# Patient Record
Sex: Female | Born: 1975 | Race: White | Hispanic: No | Marital: Single | Smoking: Never smoker
Health system: Southern US, Community
[De-identification: ages and names within clinical notes are randomized; demographics above are authoritative.]

## PROBLEM LIST (undated history)

## (undated) HISTORY — PX: FINGER SURGERY: SHX640

## (undated) HISTORY — PX: BREAST BIOPSY: SHX20

---

## 1998-02-26 ENCOUNTER — Ambulatory Visit (HOSPITAL_COMMUNITY): Admission: RE | Admit: 1998-02-26 | Discharge: 1998-02-26 | Payer: Self-pay | Admitting: Obstetrics & Gynecology

## 1998-02-27 ENCOUNTER — Other Ambulatory Visit: Admission: RE | Admit: 1998-02-27 | Discharge: 1998-02-27 | Payer: Self-pay | Admitting: Obstetrics & Gynecology

## 1998-07-14 ENCOUNTER — Inpatient Hospital Stay (HOSPITAL_COMMUNITY): Admission: AD | Admit: 1998-07-14 | Discharge: 1998-07-14 | Payer: Self-pay | Admitting: Obstetrics & Gynecology

## 1998-07-14 ENCOUNTER — Emergency Department (HOSPITAL_COMMUNITY): Admission: EM | Admit: 1998-07-14 | Discharge: 1998-07-14 | Payer: Self-pay | Admitting: Emergency Medicine

## 1998-07-27 ENCOUNTER — Inpatient Hospital Stay (HOSPITAL_COMMUNITY): Admission: AD | Admit: 1998-07-27 | Discharge: 1998-07-30 | Payer: Self-pay | Admitting: Obstetrics and Gynecology

## 1998-07-30 ENCOUNTER — Encounter (HOSPITAL_COMMUNITY): Admission: RE | Admit: 1998-07-30 | Discharge: 1998-10-28 | Payer: Self-pay | Admitting: *Deleted

## 1999-04-12 ENCOUNTER — Encounter: Payer: Self-pay | Admitting: Emergency Medicine

## 1999-04-12 ENCOUNTER — Emergency Department (HOSPITAL_COMMUNITY): Admission: EM | Admit: 1999-04-12 | Discharge: 1999-04-12 | Payer: Self-pay | Admitting: Emergency Medicine

## 2002-06-07 ENCOUNTER — Emergency Department (HOSPITAL_COMMUNITY): Admission: EM | Admit: 2002-06-07 | Discharge: 2002-06-07 | Payer: Self-pay | Admitting: Emergency Medicine

## 2003-10-07 ENCOUNTER — Emergency Department (HOSPITAL_COMMUNITY): Admission: EM | Admit: 2003-10-07 | Discharge: 2003-10-07 | Payer: Self-pay | Admitting: Emergency Medicine

## 2005-02-09 ENCOUNTER — Inpatient Hospital Stay (HOSPITAL_COMMUNITY): Admission: EM | Admit: 2005-02-09 | Discharge: 2005-02-09 | Payer: Self-pay | Admitting: Family Medicine

## 2005-02-23 ENCOUNTER — Ambulatory Visit (HOSPITAL_COMMUNITY): Admission: RE | Admit: 2005-02-23 | Discharge: 2005-02-23 | Payer: Self-pay | Admitting: *Deleted

## 2005-08-17 ENCOUNTER — Emergency Department (HOSPITAL_COMMUNITY): Admission: EM | Admit: 2005-08-17 | Discharge: 2005-08-17 | Payer: Self-pay | Admitting: Emergency Medicine

## 2005-11-09 ENCOUNTER — Inpatient Hospital Stay (HOSPITAL_COMMUNITY): Admission: EM | Admit: 2005-11-09 | Discharge: 2005-11-11 | Payer: Self-pay | Admitting: Family Medicine

## 2006-09-12 ENCOUNTER — Emergency Department (HOSPITAL_COMMUNITY): Admission: EM | Admit: 2006-09-12 | Discharge: 2006-09-12 | Payer: Self-pay | Admitting: Emergency Medicine

## 2007-08-16 ENCOUNTER — Emergency Department (HOSPITAL_COMMUNITY): Admission: EM | Admit: 2007-08-16 | Discharge: 2007-08-16 | Payer: Self-pay | Admitting: Family Medicine

## 2007-09-29 ENCOUNTER — Emergency Department (HOSPITAL_COMMUNITY): Admission: EM | Admit: 2007-09-29 | Discharge: 2007-09-29 | Payer: Self-pay | Admitting: Family Medicine

## 2008-06-19 ENCOUNTER — Inpatient Hospital Stay (HOSPITAL_COMMUNITY): Admission: AD | Admit: 2008-06-19 | Discharge: 2008-06-19 | Payer: Self-pay | Admitting: Obstetrics and Gynecology

## 2008-06-20 ENCOUNTER — Inpatient Hospital Stay (HOSPITAL_COMMUNITY): Admission: AD | Admit: 2008-06-20 | Discharge: 2008-06-24 | Payer: Self-pay | Admitting: Obstetrics and Gynecology

## 2009-04-23 ENCOUNTER — Emergency Department (HOSPITAL_COMMUNITY): Admission: EM | Admit: 2009-04-23 | Discharge: 2009-04-23 | Payer: Self-pay | Admitting: Family Medicine

## 2009-06-21 ENCOUNTER — Emergency Department (HOSPITAL_COMMUNITY): Admission: EM | Admit: 2009-06-21 | Discharge: 2009-06-21 | Payer: Self-pay | Admitting: Family Medicine

## 2009-10-19 ENCOUNTER — Emergency Department (HOSPITAL_COMMUNITY): Admission: EM | Admit: 2009-10-19 | Discharge: 2009-10-19 | Payer: Self-pay | Admitting: Family Medicine

## 2009-11-20 ENCOUNTER — Emergency Department (HOSPITAL_COMMUNITY): Admission: EM | Admit: 2009-11-20 | Discharge: 2009-11-20 | Payer: Self-pay | Admitting: Family Medicine

## 2010-08-15 ENCOUNTER — Encounter: Payer: Self-pay | Admitting: *Deleted

## 2010-12-07 NOTE — Op Note (Signed)
Zoe King, Zoe King               ACCOUNT NO.:  [id number]   MEDICAL RECORD NO.:  [id number]          PATIENT TYPE:  INP   LOCATION:  9127                          FACILITY:  [facility]   PHYSICIAN:  Malva Limes, M.D.    DATE OF BIRTH:  Apr 02, 1976   DATE OF PROCEDURE:  06/21/2008  DATE OF DISCHARGE:                               OPERATIVE REPORT   PREOPERATIVE DIAGNOSES:  1. Intrauterine pregnancy at term.  2. History of prior cesarean section.  3. Attempted vaginal birth after cesarean section.  4. Fetal decelerations.   POSTOPERATIVE DIAGNOSES:  1. Intrauterine pregnancy at term.  2. History of prior cesarean section.  3. Attempted vaginal birth after cesarean section.  4. Fetal decelerations.   PROCEDURE:  Repeat low transverse cesarean section.   SURGEON:  Malva Limes, MD   ANESTHESIA:  Epidural.   ANTIBIOTICS:  Ancef 1 g.   DRAINS:  Foley to bedside drainage.   ESTIMATED BLOOD LOSS:  900 mL.   SPECIMENS:  None.   FINDINGS:  The patient had normal fallopian tubes and ovaries  bilaterally.  The uterus appeared to be normal.  The scar was intact.  The patient had several omental adhesions to the anterior abdominal  wall, which were taken down with the Bovie.  The patient delivered one  live viable infant in the OP presentation.  Meconium fluid was noted.  There was no nuchal cord.  Placenta appeared to be normal.   PROCEDURE:  The patient was taken to the operating room, where she was  placed in dorsal supine position with a left lateral tilt.  Once an  adequate level was reached, the patient was prepped and draped in usual  fashion for this procedure.  A Pfannenstiel incision was made through  the previous scar.  On entering the abdominal cavity, the bladder flap  was taken down with sharp dissection.  A low transverse uterine incision  was made in the midline with Metzenbaum scissors and extended laterally  with blunt dissection.  Meconium-stained amniotic fluid  was noted.  At  this point, the infant was delivered in the vertex presentation.  On the  delivery of the head, the oropharynx and nostrils were bulb suctioned.  The remaining infant was then delivered.  The cord was doubly clamped  and cut and the infant handed to awaiting NICU team.  The placenta was  then manually removed.  The uterus was exteriorized.  The uterine cavity  with a wet lap and inspected.  The uterine incision was closed in a  single layer of 0 Monocryl suture in a running locking fashion.  The  bladder flap was closed using 2-0 Monocryl suture in a running fashion.  The uterus was placed back in the abdominal cavity.  Hemostasis was  again checked and felt to be adequate.  The parietal peritoneum and  rectus muscles were approximated in midline with 2-0 Monocryl in a  running fashion.  The fascia was closed using 0 Monocryl suture in a  running fashion.  The subcuticular tissue was made hemostatic with the  Bovie.  The subcuticular tissue  was closed using interrupted 2-0 plain  gut suture.  The skin was closed using stainless steel clips.  The  patient tolerated the procedure well.  She was taken to recovery room in  stable condition.  Instrument and lap count was correct x2.           ______________________________  Malva Limes, M.D.     MA/MEDQ  D:  06/21/2008  T:  06/21/2008  Job:  [id number]

## 2010-12-10 NOTE — H&P (Signed)
Zoe King, Zoe King               ACCOUNT NO.:  [id number]   MEDICAL RECORD NO.:  [id number]          PATIENT TYPE:  INP   LOCATION:  1824                         FACILITY:  [facility]   PHYSICIAN:  Nelma Rothman, MD   DATE OF BIRTH:  August 02, 1975   DATE OF ADMISSION:  11/09/2005  DATE OF DISCHARGE:                                HISTORY & PHYSICAL   PRIMARY CARE PHYSICIAN:  Unassigned.   CHIEF COMPLAINT:  Shortness of breath.   HISTORY OF PRESENT ILLNESS:  The patient is a 35 year old female with a  known history of asthma, who is presenting with a 1-week history of  increasing shortness of breath and wheezing.  She is using her albuterol  inhaler quite frequently at home.  She has a cough productive of yellow  sputum and sounds quite congested.  She states that she felt warm  yesterday, but no documented fever.  She received continuous nebulizer  treatments as well as IV Solu-Medrol and IV magnesium in the emergency  department and she is feeling much better, but oxygen saturation on room air  is still only 88%, so we are asked to admit her for further treatment of  asthma exacerbation.   PAST MEDICAL HISTORY:  Asthma, has only required hospitalization once before  for her asthma.   ALLERGIES:  VICODIN causes a rash.   MEDICATIONS:  Albuterol inhaler as needed.   SOCIAL HISTORY:  She lives in [place] and works as a Financial risk analyst.  She quit  smoking 8 years ago and drinks alcohol only rarely.   FAMILY HISTORY:  Grandmother has heart disease.  Mom and dad are alive and  well.   REVIEW OF SYSTEMS:  Negative 10-point review of systems except as noted in  HPI.   PHYSICAL EXAM:  VITAL SIGNS:  Temperature 97.5, blood pressure 144/68, pulse  98, respiration rate 22.  Oxygen saturation 88% on room air and now 98% on 2  L after continuous nebulizers and Solu-Medrol.  GENERAL:  She is slightly dyspneic, but conversant.  HEART:  Regular rate and rhythm with no murmurs, rubs or gallops.  LUNGS:  Her lungs still sound quite tight with diffuse wheezing.  ABDOMEN:  Soft, nontender and nondistended with normoactive bowel sounds.  EXTREMITIES:  There is no edema.   LABORATORY DATA:  White blood cell count 11.1, hemoglobin 14.4, hematocrit  42.6 and platelet count 329,000.  Sodium 140, potassium 3.4, chloride 108,  BUN 12, creatinine 0.70 and glucose 144.   Chest x-ray demonstrates mild changes of asthma versus bronchitis, but no  consolidation and overall stable since July 2006.   ASSESSMENT AND PLAN:  1.  Asthma exacerbation.  We will continue intravenous Solu-Medrol and q.6      h. nebulizer treatments.  We will add Singulair as I suspect seasonal      allergies are playing a large component in this exacerbation.      Leukocytosis is difficult to interpret, since she had already received      her steroids, so we will follow conservatively off antibiotics, since no      evidence  of pneumonia.  2.  Hyperglycemia, again likely secondary to steroids.  However, we will      check hemoglobin A1c.      Nelma Rothman, MD  Electronically Signed     RAR/MEDQ  D:  11/09/2005  T:  11/09/2005  Job:  [id number]

## 2010-12-10 NOTE — Discharge Summary (Signed)
NAMEKINLEY, DOZIER               ACCOUNT NO.:  [id number]   MEDICAL RECORD NO.:  [id number]          PATIENT TYPE:  INP   LOCATION:  6713                         FACILITY:  [facility]   PHYSICIAN:  Mobolaji B. Bakare, M.D.DATE OF BIRTH:  12/05/1975   DATE OF ADMISSION:  11/09/2005  DATE OF DISCHARGE:  11/11/2005                                 DISCHARGE SUMMARY   PRIMARY CARE PHYSICIAN:  Unassigned.   FINAL DIAGNOSES:  1.  Asthma exacerbation.  2.  Obesity.   PROCEDURE:  Chest x-ray showed mild changes asthma versus bronchitis without  localized consolidation. This was done on the 17th of April 2007.   BRIEF HISTORY:  Ms. Altland is a 35 year old patient known with asthma who  presented with one week of persistent coughing, increased shortness of  breath and wheezing. She uses albuterol MDI frequently. She stated more than  3 times a week. In addition, she is aware that she reacts to cat. One of her  sisters has a cat and she frequently goes to her sister's house.   This current acute exacerbation was not amendable to home regimen of  frequent albuterol use and she decided to come to the emergency room.  Initial vitals she was afebrile, blood pressure was hemodynamically stable.  O2 sats was 88% on room air. She is improved to 92% on 2 liters. Lung  examination revealed diffuse wheezing bilaterally. She was given Solu-Medrol  and nebulizer in the emergency room and subsequently admitted.   HOSPITAL COURSE:  Ms. Ackerley was consulted against exposure to known  allergies. She was continued on IV Solu-Medrol. This was transitioned to  p.o. prednisone. In addition, she received Singulair nebulization with  albuterol. She improved over the course of next 38-72 hours. She was  discharged home on Flovent and Singulair. She did give a history of  intermittent heartburn and Prilosec was given p.r.n.   DISCHARGE MEDICATIONS:  1.  Flovent HFA 88 mcg 2 tabs a day.  2.  Singulair 10 mg  daily.  3.  Albuterol 2 puffs p.r.n.  4.  Prednisone taper.  5.  Mucinex 600 mg t.i.d.   Followup with Dr. Mikeal Hawthorne.   SPECIAL INSTRUCTIONS:  Avoid cats.      Mobolaji B. Corky Downs, M.D.  Electronically Signed     MBB/MEDQ  D:  12/05/2005  T:  12/06/2005  Job:  [id number]   cc:   Lonia Blood, M.D.

## 2010-12-10 NOTE — H&P (Signed)
NAMEVOLA, BENEKE               ACCOUNT NO.:  [id number]   MEDICAL RECORD NO.:  [id number]          PATIENT TYPE:  INP   LOCATION:  1828                         FACILITY:  [facility]   PHYSICIAN:  Melissa L. Ladona Ridgel, MD  DATE OF BIRTH:  1976/01/22   DATE OF ADMISSION:  02/08/2005  DATE OF DISCHARGE:                                HISTORY & PHYSICAL   CHIEF COMPLAINT:  Shortness of breath and wheezing; I could not breathe.   PRIMARY CARE PHYSICIAN:  None.   HISTORY OF THE PRESENT ILLNESS:  The patient is a 35 year old white female  who states that she spent time at her sister's house, where there are cats,  yesterday.  She states that at the time she started to develop chest  tightness and feeling funny.  This A.M. she continued to have chest  tightness with progression on to dyspnea on exertion, cough and progressive  worsening of her symptoms.  The patient states that three to four months ago  she had a similar experience when she was visiting her mother; a person who  has cats.  She states there was no intervention at that time and she Weeded  it out.  She states the symptoms lasted one week.  Today she had taken two  Benadryl without getting any relief.  She went to the urgent care center and  was sent to the emergency room for further care.   REVIEW OF SYSTEMS:  The patient has a fever.  No nausea.  No vomiting.  No  itching.  No rash.  No dysuria.  No constipation.  No diarrhea.  All other  systems are negative.  Please see the HPI for pertinent positives.   PAST MEDICAL HISTORY:  Questionable cat allergy.  Hives with VICODIN.   PAST SURGICAL HISTORY:  C section and surgery on the fifth digit of the  right hand.   SOCIAL HISTORY:  The patient quit tobacco six years ago.  She drinks  infrequent alcoholic beverages.  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   FAMILY HISTORY:  Mother is living and father is living; both have no medical  illnesses to speak of.  A sister and two  brother also have no medical  problems.   ALLERGIES:  Hives with VICODIN and allergy to CATS.   MEDICATIONS:  Medications at present are none.   PHYSICAL EXAMINATION:  VITAL SIGNS:  Temperature is 100.7 on admission and  now 98.2.  Blood pressure 122/71, heart rate is 88-106, and saturation is  89% on room air.  GENERAL APPEARANCE:  In general the patient is in no acute distress, but  does appear ill.  HEENT:  The patient is normocephalic and atraumatic.  Pupils equal, round  and react to light.  Extraocular muscles are intact.  Mucous membranes are  moist.  NECK:  The neck is supple.  There is no JVD.  No lymph nodes and no carotid  bruits.  CHEST:  The chest shows decreased air entry bilaterally with no wheezing,  but with occasional rhonchi heard.  HEART:  Cardiovascular shows regular rate  and rhythm.  Positive S1 and S2.  No S3 or S4.  No murmurs, rubs or gallops.  ABDOMEN:  The abdomen is soft and nontender and nondistended with positive  bowel sounds.  EXTREMITIES:  The extremities show no clubbing, cyanosis or edema.  NEUROLOGIC EXAMINATION:  Neurologically she is awake, alert and oriented  times three.  Cranial nerves II-XII are intact.  Power is 5/5.   LABORATORY DATA:  Currently her sodium is 136, potassium 3.8, chloride 107,  BUN 13, and blood glucose 121.  White count is 17.4 with 88% neutrophilic  predominance and eosinophils are only 1%, her hemoglobin is 24.5, hematocrit  is 43, and platelets are 340,000.  Her D-dimer is 0.36.  Chest x-ray shows  bronchitic changes, but no obvious infiltrates.  Peak flow prior to nebulize  treatment was 158, post peak flow was 260.   ASSESSMENT AND PLAN:  This is a 35 year old white female who appears to have  an acute asthmatic attack, possibly exacerbated by CAT exposure; however, at  this time I cannot rule out an acute bronchitis or alternative source for  her high fever and white count.  More than likely this is an acute  asthmatic  attack.  The patient experienced a similar episode just three to four months  ago with spontaneous resolution over the course of a week.   1.  Pulmonary; allergic asthma with fever and increased white count.   We will continue nebulizers q.4 hours and q.2 hours p.r.n.  We will start  her on ceftriaxone  and azithromycin.  We will start prednisone 40 mg p.o.  daily with a rapid taper.  She will need outpatient pulmonary follow up for  possible asthma.  Supplemental O2 will be maintained at this time.   1.  Cardiovascular; blood pressure and pulse are stable.  2.  Gastrointestinal; no current complaints, but we will start her on      Protonix p.o. for reflux protection while on steroids.  3.  Genitourinary;  no complaints or issues.  We will check a urine culture      and sensitivity as the source for her increased white blood cells and      fever.  4.  Endocrine; we will check her blood sugars with meals while on steroids      and hemoglobin A-1-C.  At present her blood glucose is 121.  5.  Deep venous thrombosis prophylaxis will be with PAS hose and ambulation.      If the patient continues to be with [place] for several days we may opt to      switch her to Lovenox.       MLT/MEDQ  D:  02/09/2005  T:  02/09/2005  Job:  [id number]

## 2010-12-10 NOTE — Discharge Summary (Signed)
Zoe King, Zoe King               ACCOUNT NO.:  [id number]   MEDICAL RECORD NO.:  [id number]          PATIENT TYPE:  INP   LOCATION:  9127                          FACILITY:  [facility]   PHYSICIAN:  Malva Limes, M.D.    DATE OF BIRTH:  1976/07/10   DATE OF ADMISSION:  06/20/2008  DATE OF DISCHARGE:  06/24/2008                               DISCHARGE SUMMARY   FINAL DIAGNOSES:  1. Intrauterine pregnancy at term.  2. History of prior cesarean section, attempted vaginal birth after      cesarean section.  3. Fetal decelerations.   PROCEDURE:  Repeat low transverse cesarean section.   SURGEON:  Malva Limes, MD   COMPLICATIONS:  None.   This is a 35 year old, G2, P1-0-0-1, who presents to the [facility] at term in labor.  The patient had had a prior cesarean section  with her last pregnancy and desires an attempt for VBAC with this  pregnancy.  The patient's antepartum course up to this point had been  uncomplicated.  We did notice a single umbilical artery on ultrasound,  but otherwise growth and antepartum course were good.  The patient was  admitted at this time in labor at term.  She was already 4-cm dilated,  and 80% effaced, and -2 station.  While in the ER, the patient was noted  to have T-cell count of 80.  AROM was performed.  The patient continued  to have recurrent variable decelerations.  Therefore, amnioinfusion was  started.  This corrected the problems.  The patient completed the first  stage of labor.  After pushing, there was some fetal decelerations down  to the 60s for about 45 minutes, but after position change in oxygen the  heart returned to normal, but after each push, there were T cells.  At  this point, a discussion was held with the patient and decision was made  to proceed with a cesarean section.  Fetal heart tones on the way to the  operating room were great in the 170s.  The patient was taken to the  operating room on June 21, 2008 by Dr. Malva Limes, where a repeat  low transverse cesarean section was performed with the delivery of a 6-  pound and 15-ounce female infant with Apgars of 9 and 9.  Delivery went  without complications.  The patient's postoperative course was benign  without any significant fevers.  The patient was felt ready for  discharge.  On postoperative day #3, she was sent home on a regular  diet, told to decrease her activities, told to continue her prenatal  vitamins, was given a prescription for Percocet 1-2 every 4-6 hours as  needed for her pain, was to follow up in our office in 4 weeks.  Instructions and precautions were reviewed with the patient.   LABS ON DISCHARGE:  The patient had a hemoglobin of 11.2, white blood  cell count of 20.0, and platelets of 234,000.      Leilani Able, P.A.-C.    ______________________________  Malva Limes, M.D.    MB/MEDQ  D:  07/30/2008  T:  07/31/2008  Job:  [id number]

## 2011-04-14 LAB — POCT RAPID STREP A: Streptococcus, Group A Screen (Direct): NEGATIVE

## 2011-04-26 LAB — CBC
HCT: 37.5 % (ref 36.0–46.0)
Hemoglobin: 12.5 g/dL (ref 12.0–15.0)
MCHC: 33.3 g/dL (ref 30.0–36.0)
MCHC: 33.4 g/dL (ref 30.0–36.0)
MCV: 80.7 fL (ref 78.0–100.0)
MCV: 81.5 fL (ref 78.0–100.0)
Platelets: 234 10*3/uL (ref 150–400)
Platelets: 251 10*3/uL (ref 150–400)
RBC: 4.65 MIL/uL (ref 3.87–5.11)
RDW: 15.6 % — ABNORMAL HIGH (ref 11.5–15.5)
RDW: 16.3 % — ABNORMAL HIGH (ref 11.5–15.5)
WBC: 14.4 10*3/uL — ABNORMAL HIGH (ref 4.0–10.5)

## 2011-06-24 ENCOUNTER — Emergency Department (INDEPENDENT_AMBULATORY_CARE_PROVIDER_SITE_OTHER)
Admission: EM | Admit: 2011-06-24 | Discharge: 2011-06-24 | Disposition: A | Discharge status: Home / Self Care | Payer: Self-pay | Source: Home / Self Care | Attending: Family Medicine | Admitting: Family Medicine

## 2011-06-24 DIAGNOSIS — M5380 Other specified dorsopathies, site unspecified: Secondary | ICD-10-CM

## 2011-06-24 DIAGNOSIS — M62830 Muscle spasm of back: Secondary | ICD-10-CM

## 2011-06-24 MED ORDER — TRAMADOL HCL 50 MG PO TABS: 50.0000 mg | ORAL_TABLET | Freq: Four times a day (QID) | ORAL | Status: AC | PRN

## 2011-06-24 MED ORDER — CYCLOBENZAPRINE HCL 5 MG PO TABS: 5.0000 mg | ORAL_TABLET | Freq: Three times a day (TID) | ORAL | Status: AC | PRN

## 2011-06-24 MED ORDER — PREDNISONE (PAK) 10 MG PO TABS: ORAL_TABLET | ORAL | Status: AC

## 2011-06-24 NOTE — ED Provider Notes (Signed)
History     CSN: [id number] Arrival date & time: 06/24/2011 10:01 AM   First MD Initiated Contact with Patient 06/24/11 1017      Chief Complaint  Patient presents with  . Back Pain    (Consider location/radiation/quality/duration/timing/severity/associated sxs/prior treatment) HPI Comments: Zoe King presents for evaluation of pain in her middle upper back that occurred upon awakening this morning. She denies any injury and no change in her sleep environment. She denies any numbness, tingling, or weakness in her upper extremity. The pain is worse with neck movement and turning of her head.  Patient is a 35 y.o. female presenting with back pain. The history is provided by the patient.  Back Pain  This is a new problem. The current episode started 6 to 12 hours ago. The problem occurs constantly. The problem has not changed since onset.The pain is associated with no known injury. The pain is present in the thoracic spine. The pain is moderate. The symptoms are aggravated by bending, twisting and certain positions. Pertinent negatives include no chest pain, no fever, no numbness, no headaches, no paresthesias, no paresis, no tingling and no weakness. She has tried nothing for the symptoms.    Past Medical History  Diagnosis Date  . Asthma     Past Surgical History  Procedure Date  . Cesarean section   . Finger surgery     No family history on file.  History  Substance Use Topics  . Smoking status: Never Smoker   . Smokeless tobacco: Not on file  . Alcohol Use: No    OB History    Grav Para Term Preterm Abortions TAB SAB Ect Mult Living                  Review of Systems  Constitutional: Negative for fever.  HENT: Positive for neck pain and neck stiffness. Negative for congestion, sore throat, rhinorrhea and trouble swallowing.   Eyes: Negative.   Respiratory: Negative.   Cardiovascular: Negative for chest pain.  Gastrointestinal: Negative.   Genitourinary: Negative.     Musculoskeletal: Positive for myalgias and back pain.  Skin: Negative.   Neurological: Negative for dizziness, tingling, weakness, light-headedness, numbness, headaches and paresthesias.    Allergies  Vicodin  Home Medications  No current outpatient prescriptions on file.  BP 129/80  Pulse 64  Temp(Src) 98.3 F (36.8 C) (Oral)  Resp 18  SpO2 98%  LMP 05/28/2011  Physical Exam  Nursing note and vitals reviewed. Constitutional: She is oriented to person, place, and time. She appears well-developed and well-nourished.  HENT:  Head: Normocephalic and atraumatic.  Eyes: EOM are normal.  Neck: Trachea normal. Neck supple. Muscular tenderness present. Decreased range of motion present. No mass and no thyromegaly present.    Pulmonary/Chest: Effort normal.  Neurological: She is alert and oriented to person, place, and time. She has normal strength.  Skin: Skin is warm and dry.    ED Course  Procedures (including critical care time)  Labs Reviewed - No data to display No results found.   No diagnosis found.    MDM  Muscle spasm, RIGHT trapezius        Richardo Priest, MD 06/24/11 1113

## 2011-06-24 NOTE — ED Notes (Signed)
C/o pain between scapula.  States it started this am- worse with movement.  Describes pain as pulling and sharp

## 2015-09-24 ENCOUNTER — Encounter (HOSPITAL_COMMUNITY): Payer: Self-pay | Admitting: Emergency Medicine

## 2015-09-24 ENCOUNTER — Emergency Department (HOSPITAL_COMMUNITY)
Admission: EM | Admit: 2015-09-24 | Discharge: 2015-09-24 | Disposition: A | Discharge status: Home / Self Care | Payer: 59 | Attending: Emergency Medicine | Admitting: Emergency Medicine

## 2015-09-24 DIAGNOSIS — M25561 Pain in right knee: Secondary | ICD-10-CM | POA: Diagnosis present

## 2015-09-24 DIAGNOSIS — J45909 Unspecified asthma, uncomplicated: Secondary | ICD-10-CM | POA: Insufficient documentation

## 2015-09-24 DIAGNOSIS — G8929 Other chronic pain: Secondary | ICD-10-CM | POA: Diagnosis not present

## 2015-09-24 MED ORDER — KETOROLAC TROMETHAMINE 60 MG/2ML IM SOLN
60.0000 mg | Freq: Once | INTRAMUSCULAR | Status: AC
  Administered 2015-09-24: 60 mg via INTRAMUSCULAR
  Filled 2015-09-24: qty 2

## 2015-09-24 MED ORDER — IBUPROFEN 800 MG PO TABS: 800.0000 mg | ORAL_TABLET | Freq: Three times a day (TID) | ORAL | Status: AC

## 2015-09-24 NOTE — ED Notes (Signed)
Patient presents for right knee pain x1 day. No known injury to same, reports yesterday went to sit in chair and felt a pulling sensation. No obvious deformity, redness, or swelling noted to same. Denies numbness or tingling. Rates pain 9/10.

## 2015-09-24 NOTE — ED Provider Notes (Signed)
CSN: [id number]     Arrival date & time 09/24/15  1319 History  By signing my name below, I, Soijett Blue, attest that this documentation has been prepared under the direction and in the presence of Shawn Joy, PA-C Electronically Signed: Soijett Blue, ED Scribe. 09/24/2015. 2:20 PM.    Chief Complaint  Patient presents with  . Knee Pain      The history is provided by the patient. No language interpreter was used.    Zoe King is a 40 y.o. female who presents to the Emergency Department complaining of constant right knee pain onset yesterday. Pt had a right knee injury occur several years ago which she was evaluated in the ED for at that time. Pt didn't go to the orthopedist due to her right knee pain resolving shortly after the ED visit. She states that she attempted to sit in a chair yesterday when she felt a "pulling sensation" to the back of her right knee and she feels as if this is a resurgence of her old right knee injury. Pt reports that her right knee pain is worsened with bearing weight and rates her right knee pain while weight bearing as 7-8/10. Pt notes that her right knee pain is alleviated with rest. Pt denies any recent injury/trauma to the right knee. Pt is having associated symptoms of gait problem due to pain. She notes that she has tried icy hot cream, tylenol, aleve, and ibuprofen for the relief of her symptoms. She denies color change, wound, rash, swelling, recent surgery, neuro deficits, or any other symptoms. Pt adds she was seen at [facility] when she had to have her left knee pain evaluated (separate incident).    Past Medical History  Diagnosis Date  . Asthma    Past Surgical History  Procedure Laterality Date  . Cesarean section    . Finger surgery     No family history on file. Social History  Substance Use Topics  . Smoking status: Never Smoker   . Smokeless tobacco: None  . Alcohol Use: Yes   OB History    No data available      Review of Systems  Constitutional: Negative for fever and chills.  Gastrointestinal: Negative for nausea and vomiting.  Musculoskeletal: Positive for arthralgias and gait problem (due to pain). Negative for joint swelling.  Skin: Negative for color change, rash and wound.  Neurological: Negative for weakness and numbness.      Allergies  Vicodin  Home Medications   Prior to Admission medications   Medication Sig Start Date End Date Taking? Authorizing Provider  ibuprofen (ADVIL,MOTRIN) 800 MG tablet Take 1 tablet (800 mg total) by mouth 3 (three) times daily. 09/24/15   Shawn C Joy, PA-C   BP 155/90 mmHg  Pulse 97  Temp(Src) 98.1 F (36.7 C) (Oral)  Resp 18  SpO2 96%  LMP 09/17/2015 Physical Exam  Constitutional: She is oriented to person, place, and time. She appears well-developed and well-nourished. No distress.  HENT:  Head: Normocephalic and atraumatic.  Neck: Neck supple.  Cardiovascular: Normal rate and intact distal pulses.   Pulmonary/Chest: Effort normal. No respiratory distress.  Musculoskeletal: Normal range of motion.       Right knee: She exhibits no swelling, no effusion, no deformity, no LCL laxity and no MCL laxity. Tenderness found.  Full ROM, both passive and active. No discernible swelling or effusion. Minor tenderness on the posterior of the right knee. Right knee is not warm to the  touch when compared to left knee. No laxity, crepitus or deformity. CMS intact distally. dp pulses intact.  Neurological: She is alert and oriented to person, place, and time. She has normal reflexes.  No sensory deficits. Strength 5/5.   Skin: Skin is warm and dry. No erythema.  Psychiatric: She has a normal mood and affect. Her behavior is normal.  Nursing note and vitals reviewed.   ED Course  Procedures (including critical care time) DIAGNOSTIC STUDIES: Oxygen Saturation is 96% on RA, nl by my interpretation.    COORDINATION OF CARE: 2:19 PM Discussed treatment  plan with pt at bedside which includes ibuprofen, knee sleeve, crutches, referral and follow up with orthopedist, and pt agreed to plan.     MDM   Final diagnoses:  Knee pain, chronic, right    Zoe King presents with right knee pain that recurred yesterday.  This patient has a normal neuro exam and no functional deficits. Suspicion is very low for septic joint and the patient has no risk factors for such. No indication for imaging at this time due to the lack of trauma and the normal physical exam. Patient was advised to follow-up with orthopedics. Knee sleeve and crutches provided. Return precautions discussed. Patient voiced understanding of instructions.  I personally performed the services described in this documentation, which was scribed in my presence. The recorded information has been reviewed and is accurate.   Anselm Pancoast, PA-C 09/24/15 1440  Bethann Berkshire, MD 09/25/15 1324

## 2015-09-24 NOTE — Discharge Instructions (Signed)
You have been seen today for knee pain. All up with orthopedics for further evaluation and chronic management of this issue. Follow up with PCP as needed. Return to ED should symptoms worsen.   Emergency Department Resource Guide 1) Find a Doctor and Pay Out of Pocket Although you won't have to find out who is covered by your insurance plan, it is a good idea to ask around and get recommendations. You will then need to call the office and see if the doctor you have chosen will accept you as a new patient and what types of options they offer for patients who are self-pay. Some doctors offer discounts or will set up payment plans for their patients who do not have insurance, but you will need to ask so you aren't surprised when you get to your appointment.  2) Contact Your Local Health Department Not all health departments have doctors that can see patients for sick visits, but many do, so it is worth a call to see if yours does. If you don't know where your local health department is, you can check in your phone book. The CDC also has a tool to help you locate your state's health department, and many state websites also have listings of all of their local health departments.  3) Find a Walk-in Clinic If your illness is not likely to be very severe or complicated, you may want to try a walk in clinic. These are popping up all over the country in pharmacies, drugstores, and shopping centers. They're usually staffed by nurse practitioners or physician assistants that have been trained to treat common illnesses and complaints. They're usually fairly quick and inexpensive. However, if you have serious medical issues or chronic medical problems, these are probably not your best option.  No Primary Care Doctor: - Call Health Connect at  [phone] - they can help you locate a primary care doctor that  accepts your insurance, provides certain services, etc. - Physician Referral Service- [phone]  Chronic  Pain Problems: Organization         Address  Phone   Notes  [facility]  [phone] Patients need to be referred by their primary care doctor.   Medication Assistance: Organization         Address  Phone   Notes  [facility] Medication [facility] [address] [place] [place], [place] [address] [phone] --Must be a resident of [facility] -- Must have NO insurance coverage whatsoever (no Medicaid/ Medicare, etc.) -- The pt. MUST have a primary care doctor that directs their care regularly and follows them in the community   [facility]  [phone]   [employer]  [phone]    Agencies that provide inexpensive medical care: Organization         Address  Phone   Notes  [facility]  [phone]   [facility]    [phone]   [facility] [address] [place], [place] [address] [phone]   [facility] [address], [place] [phone]   [facility]    [phone]   [facility]    [phone]   [facility]  [address], [place] Phone:  [phone], Fax:  [phone] Hours of Operation:  9 am - 6 pm, M-F.  Also accepts Medicaid/Medicare and self-pay.  [facility]  [address], [place] Phone: [phone], Fax: [phone]. Hours of Operation:  8:30 am - 5:30 pm, M-F.  Also accepts Medicaid and self-pay.  [facility] [place] [address] [place] Phone: [phone]   [facility] [address] Natasha Bence [place], [place] [phone], Ext. [phone] Mondays & Thursdays: 7-9 AM.  First 15 patients are seen on a first come, first serve basis.    Medicaid-accepting [facility] Providers:  Organization         Address  Phone   Notes  [facility] [address], [place] [phone] Also  accepts self-pay patients.  [facility] [address] Laurell Josephs [place], [place]  [phone]   [facility] [address], [place] [phone]   [facility] [address] [place] [phone]   Renaye Rakers [address], [place]   [phone] Only accepts [place] patients after they have their name applied to their card.   Self-Pay (no insurance) in [facility]:  Organization         Address  Phone   Notes  Sickle Cell Patients, [facility] [address] [place], [place] [phone]   [facility] [address] [place], [place] [phone]   [facility] [place]  [address], [place] [phone]   [facility]/Dr. Osei-Bonsu  [address] [place] or [address], [place] [phone] Phone number for both [place] and [place] locations is the same.  [facility] [address] [place] [phone]   [facility] [address] [place] or [address] [phone]   [facility] [address] [place] [phone], phone; [phone], fax Sees patients 1st and 3rd Saturday of every month.  Must not qualify for public or private insurance (i.e. Medicaid, Medicare, [place] Health Choice, Veterans' Benefits)  Household income should be no more than 200% of the poverty level The clinic cannot treat you if you are pregnant or think you are pregnant  Sexually transmitted diseases are not treated at the clinic.

## 2015-11-27 ENCOUNTER — Other Ambulatory Visit: Payer: Self-pay | Admitting: Obstetrics and Gynecology

## 2015-11-27 DIAGNOSIS — R928 Other abnormal and inconclusive findings on diagnostic imaging of breast: Secondary | ICD-10-CM

## 2015-12-04 ENCOUNTER — Ambulatory Visit
Admission: RE | Admit: 2015-12-04 | Discharge: 2015-12-04 | Disposition: A | Discharge status: Home / Self Care | Payer: 59 | Source: Ambulatory Visit | Attending: Obstetrics and Gynecology | Admitting: Obstetrics and Gynecology

## 2015-12-04 ENCOUNTER — Other Ambulatory Visit: Payer: Self-pay | Admitting: Obstetrics and Gynecology

## 2015-12-04 DIAGNOSIS — R928 Other abnormal and inconclusive findings on diagnostic imaging of breast: Secondary | ICD-10-CM

## 2015-12-04 DIAGNOSIS — R921 Mammographic calcification found on diagnostic imaging of breast: Secondary | ICD-10-CM

## 2015-12-09 ENCOUNTER — Ambulatory Visit
Admission: RE | Admit: 2015-12-09 | Discharge: 2015-12-09 | Disposition: A | Discharge status: Home / Self Care | Payer: 59 | Source: Ambulatory Visit | Attending: Obstetrics and Gynecology | Admitting: Obstetrics and Gynecology

## 2015-12-09 DIAGNOSIS — R921 Mammographic calcification found on diagnostic imaging of breast: Secondary | ICD-10-CM

## 2021-02-11 ENCOUNTER — Other Ambulatory Visit: Payer: Self-pay

## 2021-02-11 ENCOUNTER — Emergency Department (HOSPITAL_COMMUNITY): Payer: Managed Care, Other (non HMO)

## 2021-02-11 ENCOUNTER — Emergency Department (HOSPITAL_COMMUNITY)
Admission: EM | Admit: 2021-02-11 | Discharge: 2021-02-11 | Disposition: A | Discharge status: Home / Self Care | Payer: Managed Care, Other (non HMO) | Attending: Emergency Medicine | Admitting: Emergency Medicine

## 2021-02-11 DIAGNOSIS — R109 Unspecified abdominal pain: Secondary | ICD-10-CM | POA: Insufficient documentation

## 2021-02-11 DIAGNOSIS — Z5321 Procedure and treatment not carried out due to patient leaving prior to being seen by health care provider: Secondary | ICD-10-CM | POA: Insufficient documentation

## 2021-02-11 DIAGNOSIS — M549 Dorsalgia, unspecified: Secondary | ICD-10-CM | POA: Insufficient documentation

## 2021-02-11 DIAGNOSIS — M25551 Pain in right hip: Secondary | ICD-10-CM | POA: Insufficient documentation

## 2021-02-11 DIAGNOSIS — R102 Pelvic and perineal pain: Secondary | ICD-10-CM | POA: Insufficient documentation

## 2021-02-11 LAB — I-STAT BETA HCG BLOOD, ED (MC, WL, AP ONLY): I-stat hCG, quantitative: <5 m[IU]/mL (ref <5)

## 2021-02-11 LAB — CBC
HCT: 44.9 % (ref 36.0–46.0)
Hemoglobin: 14.6 g/dL (ref 12.0–15.0)
MCH: 28.5 pg (ref 26.0–34.0)
MCHC: 32.5 g/dL (ref 30.0–36.0)
MCV: 87.7 fL (ref 80.0–100.0)
Platelets: 293 10*3/uL (ref 150–400)
RBC: 5.12 MIL/uL — ABNORMAL HIGH (ref 3.87–5.11)
RDW: 13.1 % (ref 11.5–15.5)
WBC: 9.1 10*3/uL (ref 4.0–10.5)
nRBC: 0 % (ref 0.0–0.2)

## 2021-02-11 LAB — URINALYSIS, ROUTINE W REFLEX MICROSCOPIC
Bilirubin Urine: NEGATIVE
Glucose, UA: NEGATIVE mg/dL
Hgb urine dipstick: NEGATIVE
Ketones, ur: NEGATIVE mg/dL
Leukocytes,Ua: NEGATIVE
Nitrite: NEGATIVE
Protein, ur: NEGATIVE mg/dL
Specific Gravity, Urine: 1.008 (ref 1.005–1.030)
pH: 6 (ref 5.0–8.0)

## 2021-02-11 LAB — COMPREHENSIVE METABOLIC PANEL
ALT: 24 U/L (ref 0–44)
AST: 22 U/L (ref 15–41)
Albumin: 4 g/dL (ref 3.5–5.0)
Alkaline Phosphatase: 54 U/L (ref 38–126)
Anion gap: 8 (ref 5–15)
BUN: 20 mg/dL (ref 6–20)
CO2: 22 mmol/L (ref 22–32)
Calcium: 9.1 mg/dL (ref 8.9–10.3)
Chloride: 108 mmol/L (ref 98–111)
Creatinine, Ser: 0.61 mg/dL (ref 0.44–1.00)
GFR, Estimated: >60 mL/min (ref >60)
Glucose, Bld: 100 mg/dL — ABNORMAL HIGH (ref 70–99)
Potassium: 4 mmol/L (ref 3.5–5.1)
Sodium: 138 mmol/L (ref 135–145)
Total Bilirubin: 0.7 mg/dL (ref 0.3–1.2)
Total Protein: 6.9 g/dL (ref 6.5–8.1)

## 2021-02-11 MED ORDER — ACETAMINOPHEN 325 MG PO TABS
650.0000 mg | ORAL_TABLET | Freq: Once | ORAL | Status: AC | PRN
  Administered 2021-02-11: 650 mg via ORAL
  Filled 2021-02-11: qty 2

## 2021-02-11 NOTE — ED Notes (Signed)
The patient decided to leave without being seen by a provider

## 2021-02-11 NOTE — ED Provider Notes (Signed)
Emergency Medicine Provider Triage Evaluation Note  Zoe King , a 45 y.o. female  was evaluated in triage.  Pt complains of right lumbar back/right flank pain.  Patient reports that pain started on Monday and has been constant since then.  Pain radiates to right lower quadrant.  Patient denies any recent falls or injuries.  Review of Systems  Positive: Back pain Negative: Fever, chills, dysuria, hematuria, urinary frequency, vaginal pain, vaginal bleeding, vaginal discharge numbness, weakness, saddle anesthesia, nausea, vomiting, abdominal pain  Physical Exam  BP (!) 147/110 (BP Location: Right Arm)   Pulse 62   Temp 97.9 F (36.6 C)   Resp 16   SpO2 97%  Gen:   Awake, no distress   Resp:  Normal effort  MSK:   Moves extremities without difficulty.  No midline tenderness or deformity to cervical, thoracic, lumbar spine.  Patient does have tenderness right lumbar back Other:  Abdomen soft, nondistended, nontender.  No guarding, rebound tenderness, CVA tenderness.  Medical Decision Making  Medically screening exam initiated at 9:58 AM.  Appropriate orders placed.  Pam Vanalstine Custard was informed that the remainder of the evaluation will be completed by another provider, this initial triage assessment does not replace that evaluation, and the importance of remaining in the ED until their evaluation is complete.  The patient appears stable so that the remainder of the work up may be completed by another provider.      Haskel Schroeder, PA-C 02/11/21 1000    Tegeler, Canary Brim, MD 02/11/21 1052

## 2021-02-11 NOTE — ED Notes (Signed)
Pt states she can tolerate tylenol

## 2021-02-11 NOTE — ED Triage Notes (Signed)
Pt here POV with c/o of back pain radiating to flank. Denies urinary symptoms. Denies falls or trauma.

## 2021-05-26 ENCOUNTER — Other Ambulatory Visit: Payer: Self-pay | Admitting: Obstetrics and Gynecology

## 2021-05-26 DIAGNOSIS — N644 Mastodynia: Secondary | ICD-10-CM

## 2021-06-15 ENCOUNTER — Other Ambulatory Visit: Payer: Self-pay

## 2021-06-15 ENCOUNTER — Ambulatory Visit
Admission: RE | Admit: 2021-06-15 | Discharge: 2021-06-15 | Disposition: A | Discharge status: Home / Self Care | Payer: Managed Care, Other (non HMO) | Source: Ambulatory Visit | Attending: Obstetrics and Gynecology | Admitting: Obstetrics and Gynecology

## 2021-06-15 ENCOUNTER — Ambulatory Visit: Payer: 59

## 2021-06-15 DIAGNOSIS — N644 Mastodynia: Secondary | ICD-10-CM

## 2022-02-07 ENCOUNTER — Encounter (HOSPITAL_BASED_OUTPATIENT_CLINIC_OR_DEPARTMENT_OTHER): Payer: Self-pay

## 2022-02-07 ENCOUNTER — Other Ambulatory Visit: Payer: Self-pay

## 2022-02-07 ENCOUNTER — Emergency Department (HOSPITAL_BASED_OUTPATIENT_CLINIC_OR_DEPARTMENT_OTHER)
Admission: EM | Admit: 2022-02-07 | Discharge: 2022-02-07 | Disposition: A | Discharge status: Home / Self Care | Payer: BC Managed Care – PPO | Attending: Emergency Medicine | Admitting: Emergency Medicine

## 2022-02-07 DIAGNOSIS — S0086XA Insect bite (nonvenomous) of other part of head, initial encounter: Secondary | ICD-10-CM | POA: Insufficient documentation

## 2022-02-07 DIAGNOSIS — W57XXXA Bitten or stung by nonvenomous insect and other nonvenomous arthropods, initial encounter: Secondary | ICD-10-CM | POA: Insufficient documentation

## 2022-02-07 MED ORDER — CEPHALEXIN 500 MG PO CAPS
500.0000 mg | ORAL_CAPSULE | Freq: Four times a day (QID) | ORAL | 0 refills | Status: AC
Start: 2022-02-07 — End: ?

## 2022-02-07 MED ORDER — CEPHALEXIN 250 MG PO CAPS
500.0000 mg | ORAL_CAPSULE | Freq: Once | ORAL | Status: AC
  Administered 2022-02-07: 500 mg via ORAL
  Filled 2022-02-07: qty 2

## 2022-02-07 NOTE — ED Notes (Signed)
Reviewed AVS/discharge instruction with patient. Time allotted for and all questions answered. Patient is agreeable for d/c and escorted to ed exit by staff.  

## 2022-02-07 NOTE — Discharge Instructions (Signed)
Begin taking Keflex as prescribed.  Apply warm compresses as frequently as possible for the next several days.  Follow-up with primary doctor if not improving in the next few days.

## 2022-02-07 NOTE — ED Triage Notes (Signed)
Patient here POV from Home.  Endorses being Stung by [employer] or Wasp on Head on Saturday. Small Localized Area of Swelling to [employer].   Seeks ED Evaluation as Patient has developed Soreness and Numbness to Posterior Left Ear that began Yesterday AM.   NAD Noted during Triage. A&Ox4. GCS 15. Ambulatory.

## 2022-02-07 NOTE — ED Provider Notes (Signed)
  [facility] EMERGENCY DEPT Provider Note   CSN: [id number] Arrival date & time: 02/07/22  1738     History  Chief Complaint  Patient presents with   Insect Bite    Zoe King is a 46 y.o. female.  Patient is a 45 year old female with no significant past medical history.  Patient presenting today with complaints of a bee sting to the top of her head.  She was clearing out brush and did not notice the hornet nest above her head.  One of the hornet stung her on top of the head.  This was several days ago.  She is now noticing swelling and redness to the area.  She is now developing redness, warmth, and discomfort behind her left ear.  She denies any loss of hearing.  She denies any aggravating or alleviating factors.  The history is provided by the patient.       Home Medications Prior to Admission medications   Medication Sig Start Date End Date Taking? Authorizing Provider  ibuprofen (ADVIL,MOTRIN) 800 MG tablet Take 1 tablet (800 mg total) by mouth 3 (three) times daily. 09/24/15   Joy, Hillard Danker, PA-C      Allergies    Vicodin [hydrocodone-acetaminophen]    Review of Systems   Review of Systems  All other systems reviewed and are negative.   Physical Exam Updated Vital Signs BP (!) 154/92 (BP Location: Right Arm)   Pulse 65   Temp 98.4 F (36.9 C)   Resp 18   Ht 5\' 3"  (1.6 m)   Wt 120.2 kg   SpO2 96%   BMI 46.94 kg/m  Physical Exam Vitals and nursing note reviewed.  Constitutional:      General: She is not in acute distress.    Appearance: Normal appearance. She is not ill-appearing.  HENT:     Head: Normocephalic and atraumatic.     Comments: There is erythema and warmth noted behind the left ear.  There is also a palpable postauricular lymph node.    Left Ear: Tympanic membrane and ear canal normal.  Pulmonary:     Effort: Pulmonary effort is normal.  Musculoskeletal:     Cervical back: Normal range of motion and neck supple.  Skin:     General: Skin is warm and dry.  Neurological:     Mental Status: She is alert and oriented to person, place, and time.     ED Results / Procedures / Treatments   Labs (all labs ordered are listed, but only abnormal results are displayed) Labs Reviewed - No data to display  EKG None  Radiology No results found.  Procedures Procedures    Medications Ordered in ED Medications  cephALEXin (KEFLEX) capsule 500 mg (has no administration in time range)    ED Course/ Medical Decision Making/ A&P  Patient presenting with a possibly infected insect bite.  This will be treated with Keflex, warm compresses, and follow-up as needed if not improving.  Final Clinical Impression(s) / ED Diagnoses Final diagnoses:  None    Rx / DC Orders ED Discharge Orders     None         , MD 02/07/22 2313

## 2022-05-31 ENCOUNTER — Encounter (HOSPITAL_BASED_OUTPATIENT_CLINIC_OR_DEPARTMENT_OTHER): Payer: Self-pay

## 2022-05-31 ENCOUNTER — Emergency Department (HOSPITAL_BASED_OUTPATIENT_CLINIC_OR_DEPARTMENT_OTHER)
Admission: EM | Admit: 2022-05-31 | Discharge: 2022-05-31 | Disposition: A | Discharge status: Home / Self Care | Payer: BC Managed Care – PPO | Attending: Emergency Medicine | Admitting: Emergency Medicine

## 2022-05-31 ENCOUNTER — Other Ambulatory Visit: Payer: Self-pay

## 2022-05-31 DIAGNOSIS — M5431 Sciatica, right side: Secondary | ICD-10-CM | POA: Insufficient documentation

## 2022-05-31 DIAGNOSIS — R209 Unspecified disturbances of skin sensation: Secondary | ICD-10-CM | POA: Insufficient documentation

## 2022-05-31 DIAGNOSIS — M545 Low back pain, unspecified: Secondary | ICD-10-CM | POA: Diagnosis present

## 2022-05-31 DIAGNOSIS — R030 Elevated blood-pressure reading, without diagnosis of hypertension: Secondary | ICD-10-CM | POA: Insufficient documentation

## 2022-05-31 DIAGNOSIS — Z79899 Other long term (current) drug therapy: Secondary | ICD-10-CM | POA: Insufficient documentation

## 2022-05-31 MED ORDER — PREDNISONE 10 MG PO TABS: 30.0000 mg | ORAL_TABLET | Freq: Every day | ORAL | 0 refills | Status: AC

## 2022-05-31 MED ORDER — AMLODIPINE BESYLATE 5 MG PO TABS
5.0000 mg | ORAL_TABLET | Freq: Every day | ORAL | 2 refills | Status: AC
Start: 2022-05-31 — End: 2022-08-29

## 2022-05-31 NOTE — ED Provider Notes (Signed)
[facility] EMERGENCY DEPT Provider Note   CSN: [id number] Arrival date & time: 05/31/22  1016     History  Chief Complaint  Patient presents with   Numbness   Back Pain    Zoe King is a 46 y.o. female.   Back Pain Patient is a 46 year old female with a past medical history without any pertinent positives.  She presents emergency room today with approximately 6 days of some discomfort in her low back and burning prickling sensation down her right leg.  She has had this sensation before but not for quite some time.  She denies any numbness or weakness in her lower extremities.  She states that she feels like she has abnormal sensation in her right leg.  She states that it is more painful at the end of the day when she is walking/has been on her feet for long period of time.  She denies any back trauma, falls is not on any blood thinners, no recent back surgeries or any history of back surgeries in the past.  She is not on any steroids and has no history of osteoporosis.  She denies any bowel or bladder incontinence denies saddle anesthesia and states that she is walking without difficulty.  She has taken Flexeril once without improvement in her symptoms.  No other medications taken     Home Medications Prior to Admission medications   Medication Sig Start Date End Date Taking? Authorizing Provider  amLODipine (NORVASC) 5 MG tablet Take 1 tablet (5 mg total) by mouth daily. 05/31/22 08/29/22 Yes Kindrick Lankford S, PA  predniSONE (DELTASONE) 10 MG tablet Take 3 tablets (30 mg total) by mouth daily for 5 days. 05/31/22 06/05/22 Yes Kaspar Albornoz S, PA  cephALEXin (KEFLEX) 500 MG capsule Take 1 capsule (500 mg total) by mouth 4 (four) times daily. 02/07/22   Veryl Speak, MD  ibuprofen (ADVIL,MOTRIN) 800 MG tablet Take 1 tablet (800 mg total) by mouth 3 (three) times daily. 09/24/15   Joy, Helane Gunther, PA-C      Allergies    Vicodin [hydrocodone-acetaminophen]    Review of  Systems   Review of Systems  Musculoskeletal:  Positive for back pain.    Physical Exam Updated Vital Signs BP (!) 162/99 (BP Location: Left Arm)   Pulse 69   Temp 98.5 F (36.9 C) (Oral)   Resp 17   Ht 5\' 3"  (1.6 m)   Wt 115.7 kg   SpO2 95%   BMI 45.17 kg/m  Physical Exam Vitals and nursing note reviewed.  Constitutional:      General: She is not in acute distress. HENT:     Head: Normocephalic and atraumatic.     Nose: Nose normal.  Eyes:     General: No scleral icterus. Cardiovascular:     Rate and Rhythm: Normal rate and regular rhythm.     Pulses: Normal pulses.     Heart sounds: Normal heart sounds.     Comments: DP PT pulses 3+ and symmetric lower extremities Pulmonary:     Effort: Pulmonary effort is normal.  Abdominal:     Palpations: Abdomen is soft.     Tenderness: There is no abdominal tenderness.  Musculoskeletal:     Cervical back: Normal range of motion.     Right lower leg: No edema.     Left lower leg: No edema.  Skin:    General: Skin is warm and dry.     Capillary Refill: Capillary refill takes less  than 2 seconds.  Neurological:     Mental Status: She is alert. Mental status is at baseline.     Comments: Sensation symmetric bilateral lower extremities, bilateral patellar and ankle reflexes intact and symmetric.  Full range of motion of bilateral lower extremities, strength 5/5 bilateral lower extremities with flexion extension at hip and knee.  Walks without difficulty  + straight leg raise R leg  Psychiatric:        Mood and Affect: Mood normal.        Behavior: Behavior normal.     ED Results / Procedures / Treatments   Labs (all labs ordered are listed, but only abnormal results are displayed) Labs Reviewed - No data to display  EKG None  Radiology No results found.  Procedures Procedures    Medications Ordered in ED Medications - No data to display  ED Course/ Medical Decision Making/ A&P                            Medical Decision Making  Patient is a 46 year old female with a past medical history without any pertinent positives.  She presents emergency room today with approximately 6 days of some discomfort in her low back and burning prickling sensation down her right leg.  She has had this sensation before but not for quite some time.  She denies any numbness or weakness in her lower extremities.  She states that she feels like she has abnormal sensation in her right leg.  She states that it is more painful at the end of the day when she is walking/has been on her feet for long period of time.  She denies any back trauma, falls is not on any blood thinners, no recent back surgeries or any history of back surgeries in the past.  She is not on any steroids and has no history of osteoporosis.  She denies any bowel or bladder incontinence denies saddle anesthesia and states that she is walking without difficulty.  She has taken Flexeril once without improvement in her symptoms.  No other medications taken   Physical exam is notable for intact bilateral DP PT pulses, strength is symmetric, walk is normal she has no gait abnormalities.  She has bilateral patellar and ankle reflexes that are symmetric.  No C, T, L-spine tenderness.  Patient's physical exam and history is consistent with sciatic nerve inflammation. We will trial patient on short course of low-dose prednisone, will also start on amlodipine 5 mg daily given her elevated blood pressure and she states that she is not on any blood pressure medications and is not uncomfortable currently. Her last ER visit 4 months ago was notable for elevated blood pressure at the time 2 with systolic of 154  History without symptoms of urinary or stool retention or incontinence, neurologic changes such as sensation change or weakness lower extremities, coagulopathy or blood thinner use, is not elderly or with history of osteoporosis, denies any history of cancer, fever, IV  drug use, weight changes (unexplained), or prolonged steroid use.   At home with a short course of prednisone, started on amlodipine, very strict return precautions for any symptoms of cauda equina including saddle anesthesia, weakness, bowel or bladder incontinence or any other new or concerning symptoms including worse symptoms of her current caliber.  I impressed on patient the importance of following up with PCP given her elevated blood pressures.  She understands and is agreeable to plan.  Final Clinical Impression(s) / ED Diagnoses Final diagnoses:  Sciatic nerve pain, right  Elevated blood pressure reading in office without diagnosis of hypertension    Rx / DC Orders ED Discharge Orders          Ordered    amLODipine (NORVASC) 5 MG tablet  Daily        05/31/22 1108    predniSONE (DELTASONE) 10 MG tablet  Daily        05/31/22 1108              Solon Augusta Trimble, [place] 05/31/22 1114    Ernie Avena, MD 05/31/22 1728

## 2022-05-31 NOTE — ED Triage Notes (Signed)
Pt c/o numbness and pain to her right foot since Thursday and reports intermittent lower back pain. Pt is alert and oriented x4. Pt has no other focal neuro deficits. Pt states being on her feet makes it worse.

## 2022-05-31 NOTE — Discharge Instructions (Addendum)
Take Tylenol and ibuprofen as discussed below.  Take the prednisone as prescribed for the next 5 days.  Read the information about sciatic pain that I have printed for you.  Follow-up with your primary care doctor to recheck your blood pressure.  Follow-up with orthopedist for continued care and treatment of your leg pain.  Return to the emergency room for any new or concerning symptoms including but not limited to loss of sensation between your legs, loss of control of urinating or defecating.  Or difficulty walking.  Please use Tylenol or ibuprofen for pain.  You may use 600 mg ibuprofen every 6 hours or 1000 mg of Tylenol every 6 hours.  You may choose to alternate between the 2.  This would be most effective.  Not to exceed 4 g of Tylenol within 24 hours.  Not to exceed 3200 mg ibuprofen 24 hours.   Do you drink plenty of water.  Take the blood pressure medication as prescribed once daily.  You may need to increase this with your primary care doctor once you are seen.

## 2023-04-08 IMAGING — MG MM DIGITAL DIAGNOSTIC UNILAT*L* W/ TOMO W/ CAD
6 series · 6 of 18 positions shown · non-contrast
Comparison: Previous exam(s).

ACR Breast Density Category a: The breast tissue is almost entirely
fatty.

CLINICAL DATA: 45-year-old female presenting for evaluation of left
breast pain. She had a firm palpable lump in the upper-outer left
breast with erythema and tenderness. Her symptoms have completely
resolved.

EXAM:
DIGITAL DIAGNOSTIC UNILATERAL LEFT MAMMOGRAM WITH TOMOSYNTHESIS AND
CAD
TECHNIQUE: Left digital diagnostic mammography and breast tomosynthesis was
performed. The images were evaluated with computer-aided detection.

[L CC synth-2D]
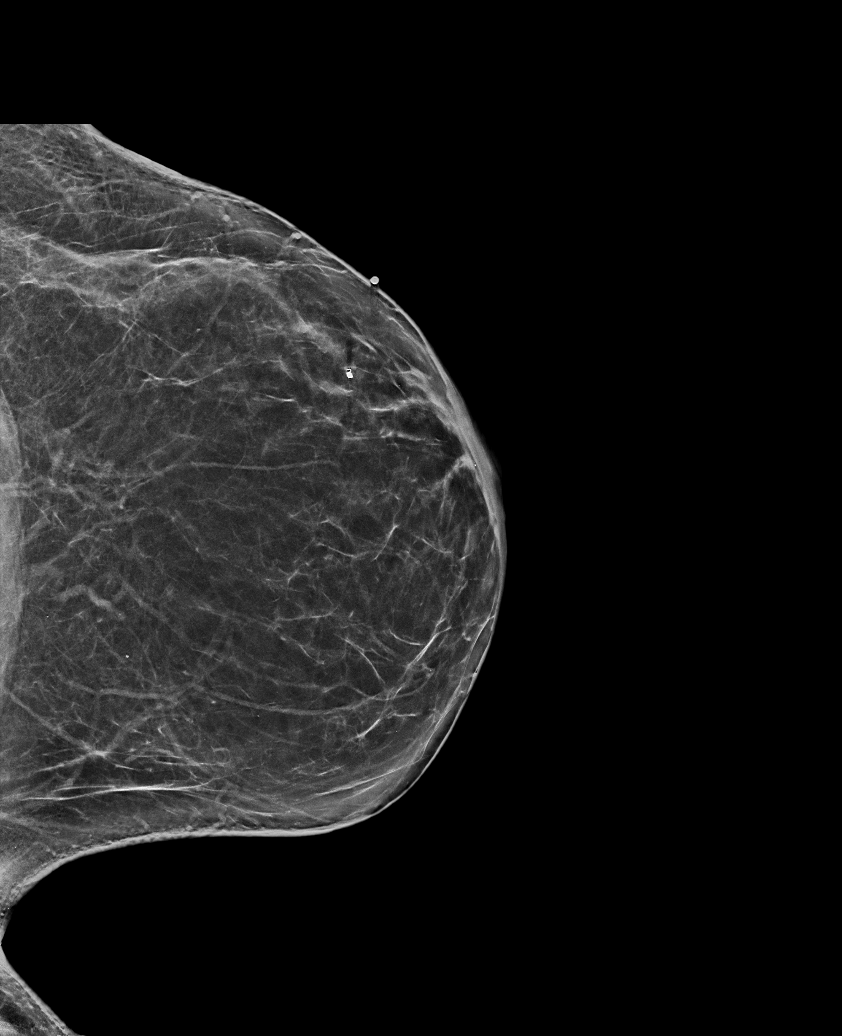

[L MLO synth-2D]
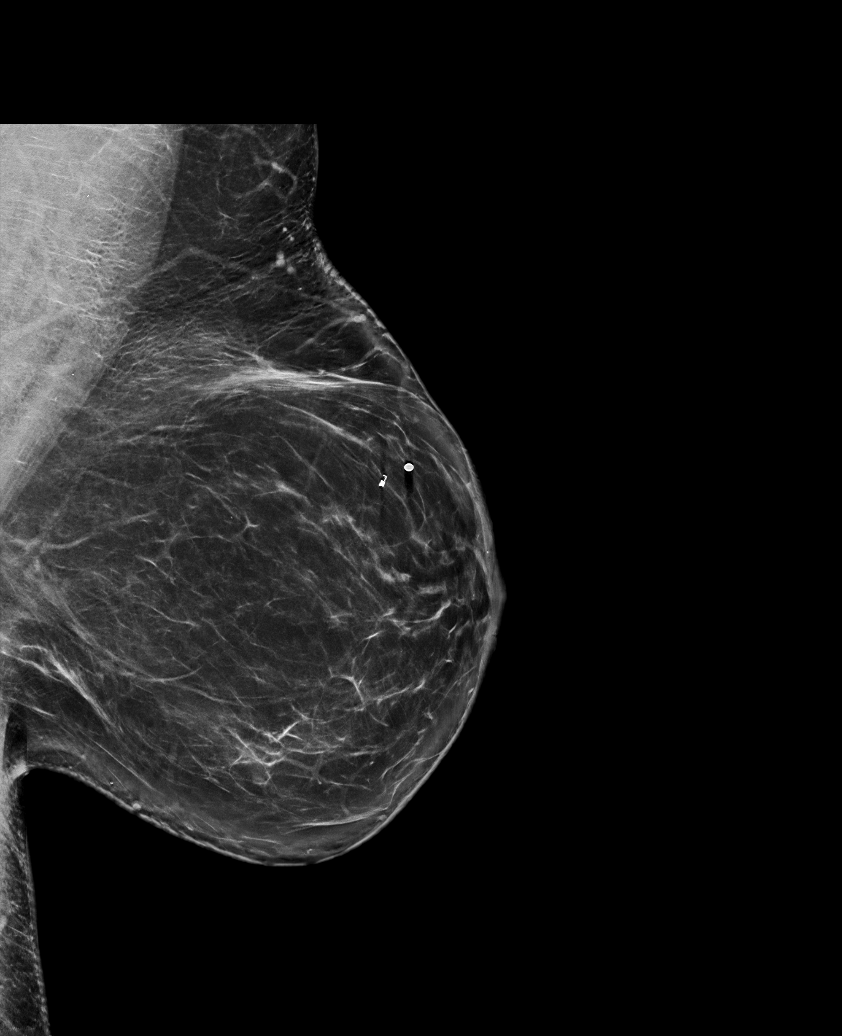

[L TAN synth-2D]
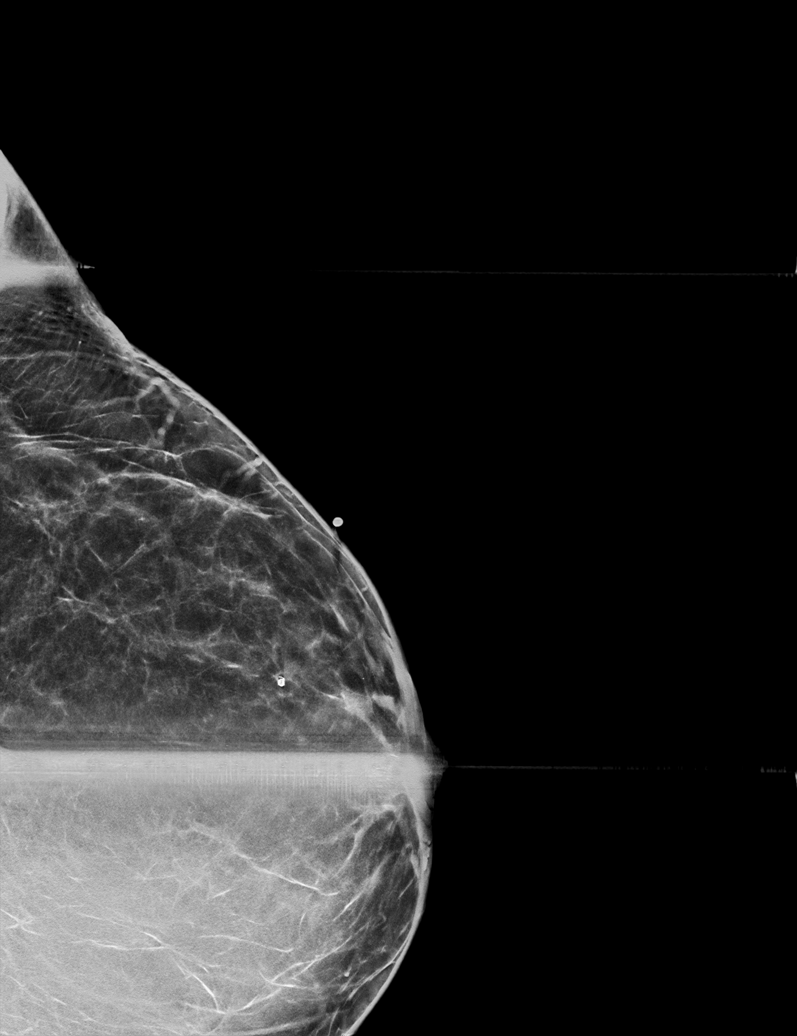

[L CC tomo · tomo slice 38/75.0]
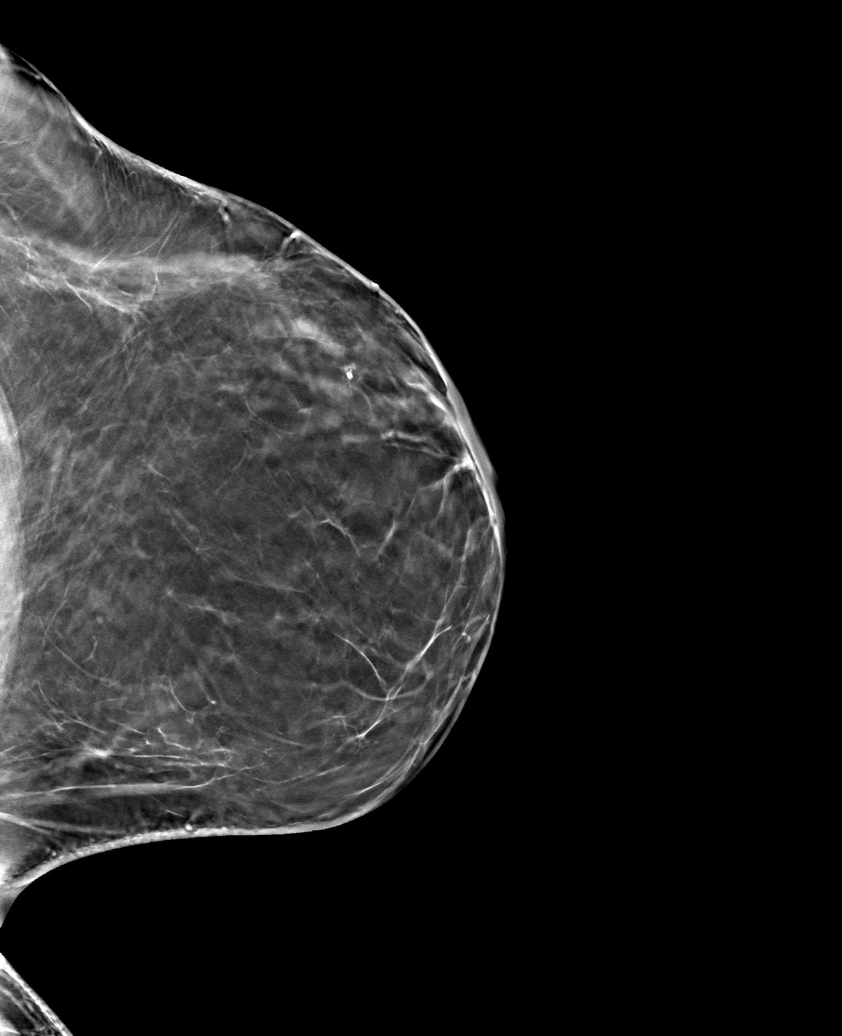

[L MLO tomo · tomo slice 46/91.0]
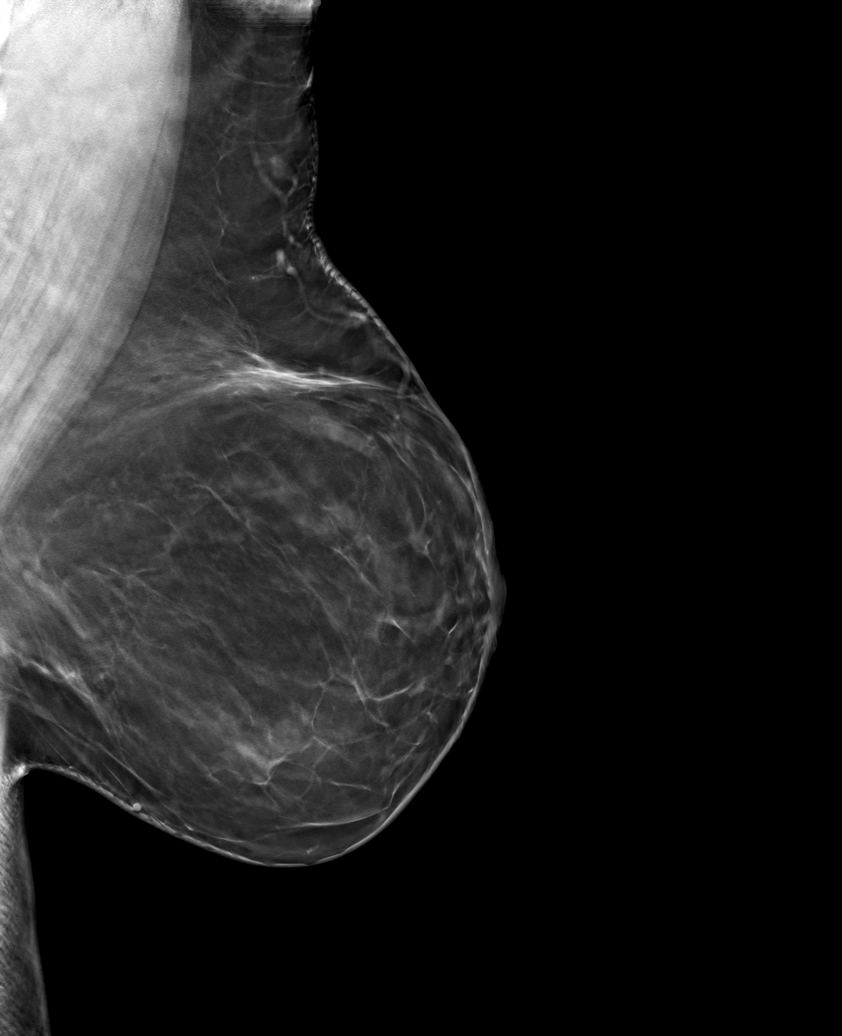

[L TAN tomo · tomo slice 29/57.0]
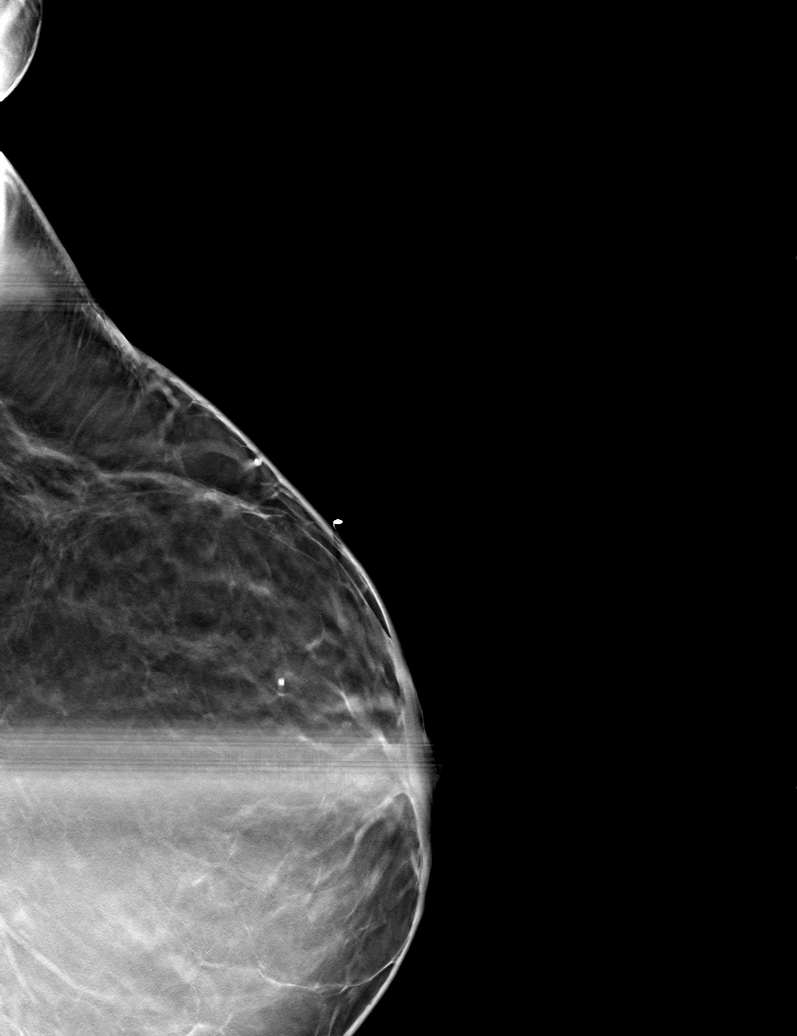

[6 of 18 positions shown; findings below may reference images not displayed]

FINDINGS: A BB indicating the previously palpable and tender area of concern
has been placed along the upper-outer quadrant of the left breast.
No suspicious findings are identified deep to this marker. No
suspicious calcifications, masses or areas of distortion are seen in
the left breast.
IMPRESSION: No suspicious findings in the upper-outer left breast in the region
of the previously tender palpable lump.

RECOMMENDATION:
Return to routine screening mammography is recommended. The patient
will be due for screening in Monday November, 2021.

I have discussed the findings and recommendations with the patient.
If applicable, a reminder letter will be sent to the patient
regarding the next appointment.

BI-RADS CATEGORY  1: Negative.
